# Patient Record
Sex: Female | Born: 1970 | Race: White | Hispanic: No | Marital: Married | Smoking: Current every day smoker
Health system: Southern US, Community
[De-identification: ages and names within clinical notes are randomized; demographics above are authoritative.]

## PROBLEM LIST (undated history)

## (undated) HISTORY — PX: NO PAST SURGERIES: SHX2092

---

## 2009-05-12 ENCOUNTER — Encounter: Payer: Self-pay | Admitting: Obstetrics & Gynecology

## 2009-05-12 ENCOUNTER — Ambulatory Visit: Payer: Self-pay | Admitting: Family Medicine

## 2009-05-23 ENCOUNTER — Encounter: Payer: Self-pay | Admitting: Obstetrics & Gynecology

## 2013-12-31 ENCOUNTER — Ambulatory Visit: Payer: Self-pay | Admitting: Family

## 2014-02-10 ENCOUNTER — Ambulatory Visit: Payer: Self-pay | Admitting: Family

## 2015-11-02 ENCOUNTER — Other Ambulatory Visit: Payer: Self-pay | Admitting: Physician Assistant

## 2015-11-02 DIAGNOSIS — Z1231 Encounter for screening mammogram for malignant neoplasm of breast: Secondary | ICD-10-CM

## 2015-11-04 ENCOUNTER — Encounter (HOSPITAL_COMMUNITY): Payer: Self-pay

## 2015-11-04 ENCOUNTER — Ambulatory Visit
Admission: RE | Admit: 2015-11-04 | Discharge: 2015-11-04 | Disposition: A | Discharge status: Home / Self Care | Payer: No Typology Code available for payment source | Source: Ambulatory Visit | Attending: Physician Assistant | Admitting: Physician Assistant

## 2015-11-04 DIAGNOSIS — Z1231 Encounter for screening mammogram for malignant neoplasm of breast: Secondary | ICD-10-CM | POA: Insufficient documentation

## 2016-11-04 ENCOUNTER — Other Ambulatory Visit: Payer: Self-pay | Admitting: Physician Assistant

## 2016-11-04 DIAGNOSIS — Z1231 Encounter for screening mammogram for malignant neoplasm of breast: Secondary | ICD-10-CM

## 2016-11-07 ENCOUNTER — Ambulatory Visit
Admission: RE | Admit: 2016-11-07 | Discharge: 2016-11-07 | Disposition: A | Discharge status: Home / Self Care | Payer: PRIVATE HEALTH INSURANCE | Source: Ambulatory Visit | Attending: Physician Assistant | Admitting: Physician Assistant

## 2016-11-07 DIAGNOSIS — Z1231 Encounter for screening mammogram for malignant neoplasm of breast: Secondary | ICD-10-CM | POA: Insufficient documentation

## 2016-11-09 ENCOUNTER — Other Ambulatory Visit: Payer: Self-pay | Admitting: Physician Assistant

## 2016-11-09 DIAGNOSIS — R928 Other abnormal and inconclusive findings on diagnostic imaging of breast: Secondary | ICD-10-CM

## 2016-11-09 DIAGNOSIS — N6489 Other specified disorders of breast: Secondary | ICD-10-CM

## 2016-11-22 ENCOUNTER — Ambulatory Visit
Admission: RE | Admit: 2016-11-22 | Discharge: 2016-11-22 | Disposition: A | Discharge status: Home / Self Care | Payer: No Typology Code available for payment source | Source: Ambulatory Visit | Attending: Physician Assistant | Admitting: Physician Assistant

## 2016-11-22 DIAGNOSIS — N6489 Other specified disorders of breast: Secondary | ICD-10-CM

## 2016-11-22 DIAGNOSIS — R928 Other abnormal and inconclusive findings on diagnostic imaging of breast: Secondary | ICD-10-CM

## 2017-08-02 ENCOUNTER — Encounter: Payer: Self-pay | Admitting: Emergency Medicine

## 2017-08-02 ENCOUNTER — Other Ambulatory Visit: Payer: Self-pay

## 2017-08-02 ENCOUNTER — Ambulatory Visit
Admission: EM | Admit: 2017-08-02 | Discharge: 2017-08-02 | Disposition: A | Discharge status: Home / Self Care | Payer: No Typology Code available for payment source | Attending: Emergency Medicine | Admitting: Emergency Medicine

## 2017-08-02 DIAGNOSIS — B86 Scabies: Secondary | ICD-10-CM

## 2017-08-02 MED ORDER — PERMETHRIN 5 % EX CREA: TOPICAL_CREAM | CUTANEOUS | 0 refills | Status: DC

## 2017-08-02 MED ORDER — PREDNISONE 10 MG (21) PO TBPK: ORAL_TABLET | ORAL | 0 refills | Status: DC

## 2017-08-02 MED ORDER — HYDROXYZINE HCL 25 MG PO TABS: 25.0000 mg | ORAL_TABLET | Freq: Four times a day (QID) | ORAL | 0 refills | Status: DC | PRN

## 2017-08-02 NOTE — ED Provider Notes (Addendum)
HPI  SUBJECTIVE:  Rhonda Rodriguez is a 47 y.o. female who presents with an intensely itchy rash with "pins-and-needles" sensation starting on her left arm, now has spread to the back of her neck for the past week.  She states it feels as if the rash is trying to go to her right arm.  She denies fevers, body aches, new lotions, soaps, detergents.  She does not take any medicines on a regular basis.  No recent antibiotics.  No exposure to poison ivy, poison oak.  She has not been in the woods or gardening recently.  She states her husband has an identical rash.  States that he got this first.  She states that the itching is worse at night.  She denies the sensation of being bitten at night, blood on the bed clothes in the morning.  They have an indoor dog, but it does not have fleas.  She tried calamine lotion, multiple itch and medicated creams with very brief improvement, no aggravating factors.  Past medical history of chickenpox.  No history of diabetes, hypertension, shingles.  PMD: None.  History reviewed. No pertinent past medical history.  History reviewed. No pertinent surgical history.  Family History  Problem Relation Age of Onset  . Breast cancer Neg Hx     Social History   Tobacco Use  . Smoking status: Current Every Day Smoker    Types: Cigarettes  . Smokeless tobacco: Never Used  Substance Use Topics  . Alcohol use: Never    Frequency: Never  . Drug use: Not on file    No current facility-administered medications for this encounter.   Current Outpatient Medications:  .  hydrOXYzine (ATARAX/VISTARIL) 25 MG tablet, Take 1 tablet (25 mg total) by mouth every 6 (six) hours as needed., Disp: 20 tablet, Rfl: 0 .  permethrin (ELIMITE) 5 % cream, Apply from chin down, leave on for 8-14 hours, rinse. Repeat in 1 week, Disp: 60 g, Rfl: 0 .  predniSONE (STERAPRED UNI-PAK 21 TAB) 10 MG (21) TBPK tablet, Dispense one 6 day pack. Take as directed with food., Disp: 21 tablet, Rfl: 0  No  Known Allergies   ROS  As noted in HPI.   Physical Exam  BP 116/80 (BP Location: Right Arm)   Pulse 63   Temp 98 F (36.7 C) (Oral)   Resp 16   Ht 5\' 1"  (1.549 m)   Wt 110 lb (49.9 kg)   SpO2 98%   BMI 20.78 kg/m   Constitutional: Well developed, well nourished, no acute distress Eyes:  EOMI, conjunctiva normal bilaterally HENT: Normocephalic, atraumatic,mucus membranes moist Respiratory: Normal inspiratory effort Cardiovascular: Normal rate GI: nondistended skin: Erythematous papular rash over the left elbow, posterior upper left arm, neck.  Positive excoriation over the right thumb.  No burrows between fingers.  No crusting, blisters.  See pictures.          Musculoskeletal: no deformities Neurologic: Alert & oriented x 3, no focal neuro deficits Psychiatric: Speech and behavior appropriate   ED Course   Medications - No data to display  No orders of the defined types were placed in this encounter.   No results found for this or any previous visit (from the past 24 hour(s)). No results found.  ED Clinical Impression  Scabies   ED Assessment/Plan  Presentation suggestive of scabies.  Will send home with permethrin with a refill so that she can treat her husband, 6-day prednisone taper, Claritin or Zyrtec, and if this  does not work, she is to try Atarax.  Will provide a primary care list for ongoing care.  She may return here or follow-up with a primary care physician of her choice if this treatment does not work.   Discussed MDM, treatment plan, and plan for follow-up with patient. patient agrees with plan.   Meds ordered this encounter  Medications  . hydrOXYzine (ATARAX/VISTARIL) 25 MG tablet    Sig: Take 1 tablet (25 mg total) by mouth every 6 (six) hours as needed.    Dispense:  20 tablet    Refill:  0  . permethrin (ELIMITE) 5 % cream    Sig: Apply from chin down, leave on for 8-14 hours, rinse. Repeat in 1 week    Dispense:  60 g     Refill:  0  . predniSONE (STERAPRED UNI-PAK 21 TAB) 10 MG (21) TBPK tablet    Sig: Dispense one 6 day pack. Take as directed with food.    Dispense:  21 tablet    Refill:  0    *This clinic note was created using Scientist, clinical (histocompatibility and immunogenetics). Therefore, there may be occasional mistakes despite careful proofreading.   ?   Domenick Gong, MD 08/02/17 1048    Domenick Gong, MD 08/02/17 1048

## 2017-08-02 NOTE — Discharge Instructions (Addendum)
Scabies is usually transmitted through close physical contact, but may be transmitted through clothing, linens, or towels. Apply the permethrin from head to soles of feet at bedtime, leave on for 8-14 hours. Mites tend to persist under the fingernails. Trim them, scrub beneath them, and apply the permetrhin under your nails. Repeat 1 week later. You will also need to treat family members, frequent household guests, close physical or sexual contacts, even if they have no symptoms. Wash all clothing, bedding, and towels in hot water, dry cleaned, or placed through the heat cycle of a dryer to prevent reinfection. Or you can place all bedding and clothing that might be infected in sealed plastic bags for 72 hours.  Make sure you thoroughly clean the infected person's room. The itching will not go away immediately. Continued itching does not mean that the treatment was ineffective. Dead mites and eggs will cause an immune response but will eventually go away as the skin turns over.   Here is a list of primary care providers who are taking new patients:  Dr. Elizabeth Sauereanna Jones, Dr. Schuyler AmorWilliam Plonk [address] [place] [address] [phone]  [facility] [place] [address]  [place] [address]  [phone]  [facility] [address]  [place], [place] [address] [phone]  [facility] [place] [address]  [phone] [place], [place] [address]  Here are clinics/ other resources who will see you if you do not have insurance. Some have certain criteria that you must meet. Call them and find out what they are:  [facility]: [address] [place] [place], [place] [address] Phone: [phone] Hours: First and Third Saturdays of each Month, 9 a.m. - 1 p.m.  [facility]: [address], [place], [place] [address] Phone: [phone] Hours: Tuesday, 4 p.m. - 8 p.m. Thursday, 1 p.m. - 8 p.m. Wednesday, 9 a.m. - [facility]  [facility] [address], [place], [place] [address] Phone: [phone] Pharmacy Phone Number: [phone] Dental Phone Number: [phone] [facility] Insurance Help: [phone]  Dental Hours: Monday - Thursday, 8 a.m. - 6 p.m.  [facility] [address] [place], [place] [address] Phone: [phone] Pharmacy Phone Number: [phone] [facility] Insurance Help: [phone]  [facility] [address] [place], [place] [address] Phone: [phone] Pharmacy Phone Number: [phone] [facility] Insurance Help: [phone]  [facility] [address] [place], [place] [address] Phone: [phone] [facility] Insurance Help: [phone]   [facility] [address] [place], [place] [address] Phone: [phone]  Go to www.goodrx.com to look up your medications. This will give you a list of where you can find your prescriptions at the most affordable prices. Or ask the pharmacist what the cash price is, or if they have any other discount programs available to help make your medication more affordable. This can be less expensive than what you would pay with insurance.

## 2017-08-02 NOTE — ED Triage Notes (Signed)
Patient c/o itchy rash on her left arm and neck for over a week.

## 2017-08-12 ENCOUNTER — Telehealth: Payer: Self-pay | Admitting: Emergency Medicine

## 2017-08-12 MED ORDER — PERMETHRIN 5 % EX CREA: TOPICAL_CREAM | CUTANEOUS | 0 refills | Status: DC

## 2017-08-12 NOTE — Telephone Encounter (Signed)
Patient called in requesting refill for Elimite cream. Stated Rhonda Rodriguez was supposed to repeat treatment in 1 week but there were no refills. [facility] [place].

## 2017-08-12 NOTE — Telephone Encounter (Signed)
Verbal order received from Renford DillsLindsey Miller, NP to send in another dose of Elimite cream for patient.

## 2018-10-20 ENCOUNTER — Other Ambulatory Visit: Payer: Self-pay | Admitting: Physician Assistant

## 2018-10-20 DIAGNOSIS — Z1231 Encounter for screening mammogram for malignant neoplasm of breast: Secondary | ICD-10-CM

## 2018-10-28 ENCOUNTER — Ambulatory Visit: Payer: No Typology Code available for payment source

## 2019-04-19 ENCOUNTER — Ambulatory Visit: Payer: No Typology Code available for payment source | Attending: Internal Medicine

## 2019-04-19 DIAGNOSIS — Z23 Encounter for immunization: Secondary | ICD-10-CM

## 2019-04-19 NOTE — Progress Notes (Signed)
   Covid-19 Vaccination Clinic  Name:  EMMA-LEE ODDO    MRN: [id number] DOB: 06-Jun-1970  04/19/2019  Ms. Derstine was observed post Covid-19 immunization for 15 minutes without incident. She was provided with Vaccine Information Sheet and instruction to access the V-Safe system.   Ms. Kronick was instructed to call 911 with any severe reactions post vaccine: Marland Kitchen Difficulty breathing  . Swelling of face and throat  . A fast heartbeat  . A bad rash all over body  . Dizziness and weakness   Immunizations Administered    Name Date Dose VIS Date Route   Pfizer COVID-19 Vaccine 04/19/2019  4:00 PM 0.3 mL 01/02/2019 Intramuscular   Manufacturer: [employer], [employer]   Lot: [id number]   NDC: [address]-1

## 2019-05-10 ENCOUNTER — Ambulatory Visit: Payer: No Typology Code available for payment source | Attending: Internal Medicine

## 2019-05-10 DIAGNOSIS — Z23 Encounter for immunization: Secondary | ICD-10-CM

## 2019-05-10 NOTE — Progress Notes (Signed)
   Covid-19 Vaccination Clinic  Name:  Rhonda Rodriguez    MRN: [id number] DOB: 21-Mar-1970  05/10/2019  [name] was observed post Covid-19 immunization for 15 minutes without incident. She was provided with Vaccine Information Sheet and instruction to access the V-Safe system.   [name] was instructed to call 911 with any severe reactions post vaccine: Marland Kitchen Difficulty breathing  . Swelling of face and throat  . A fast heartbeat  . A bad rash all over body  . Dizziness and weakness   Immunizations Administered    Name Date Dose VIS Date Route   Pfizer COVID-19 Vaccine 05/10/2019  3:19 PM 0.3 mL 01/02/2019 Intramuscular   Manufacturer: [employer], [employer]   Lot: [id number]   NDC: [address]-7

## 2019-09-24 ENCOUNTER — Ambulatory Visit
Admission: EM | Admit: 2019-09-24 | Discharge: 2019-09-24 | Disposition: A | Discharge status: Home / Self Care | Payer: BC Managed Care – PPO | Attending: Family Medicine | Admitting: Family Medicine

## 2019-09-24 ENCOUNTER — Other Ambulatory Visit: Payer: Self-pay

## 2019-09-24 DIAGNOSIS — Z20822 Contact with and (suspected) exposure to covid-19: Secondary | ICD-10-CM | POA: Insufficient documentation

## 2019-09-24 DIAGNOSIS — B9789 Other viral agents as the cause of diseases classified elsewhere: Secondary | ICD-10-CM | POA: Insufficient documentation

## 2019-09-24 DIAGNOSIS — J988 Other specified respiratory disorders: Secondary | ICD-10-CM | POA: Diagnosis present

## 2019-09-24 LAB — SARS CORONAVIRUS 2 (TAT 6-24 HRS): SARS Coronavirus 2: NEGATIVE

## 2019-09-24 MED ORDER — CETIRIZINE HCL 10 MG PO TABS: 10.0000 mg | ORAL_TABLET | Freq: Every day | ORAL | 0 refills | Status: AC

## 2019-09-24 MED ORDER — BENZONATATE 200 MG PO CAPS: 200.0000 mg | ORAL_CAPSULE | Freq: Three times a day (TID) | ORAL | 0 refills | Status: AC | PRN

## 2019-09-24 MED ORDER — ALBUTEROL SULFATE HFA 108 (90 BASE) MCG/ACT IN AERS: 1.0000 | INHALATION_SPRAY | Freq: Four times a day (QID) | RESPIRATORY_TRACT | 0 refills | Status: AC | PRN

## 2019-09-24 NOTE — ED Provider Notes (Signed)
[facility]    CSN: [id number] Arrival date & time: 09/24/19  0820      History   Chief Complaint Chief Complaint  Patient presents with  . Cough   HPI  49 year old female presents with respiratory symptoms.  Patient reports that her symptoms started on Monday.  She reports cough, congestion, chills, body aches, sneezing.  No documented fever.  She has tried several over-the-counter medications including Alka-Seltzer, Tylenol Cold and flu, aspirin, and Sudafed without relief.  No reported sick contacts.  Denies pain at this time.  No known exacerbating factors.  She is most bothered by the cough.  Patient also notes shortness of breath.  She is a current smoker.  Denies history of COPD.  No other associated symptoms.  No other complaints  Past Surgical History:  Procedure Laterality Date  . NO PAST SURGERIES      OB History   No obstetric history on file.      Home Medications    Prior to Admission medications   Medication Sig Start Date End Date Taking? Authorizing Provider  albuterol (VENTOLIN HFA) 108 (90 Base) MCG/ACT inhaler Inhale 1-2 puffs into the lungs every 6 (six) hours as needed for wheezing or shortness of breath. 09/24/19   Tommie Sams, DO  benzonatate (TESSALON) 200 MG capsule Take 1 capsule (200 mg total) by mouth 3 (three) times daily as needed for cough. 09/24/19   Tommie Sams, DO  cetirizine (ZYRTEC ALLERGY) 10 MG tablet Take 1 tablet (10 mg total) by mouth daily. 09/24/19   Tommie Sams, DO    Family History Family History  Problem Relation Age of Onset  . Diabetes Mother   . Heart attack Mother   . Hypertension Father   . ALS Father   . Breast cancer Neg Hx     Social History Social History   Tobacco Use  . Smoking status: Current Every Day Smoker    Packs/day: 0.50    Types: Cigarettes  . Smokeless tobacco: Never Used  Vaping Use  . Vaping Use: Never used  Substance Use Topics  . Alcohol use: Never  . Drug use: Never      Allergies   Patient has no known allergies.   Review of Systems Review of Systems Per HPI  Physical Exam Triage Vital Signs ED Triage Vitals  Enc Vitals Group     BP 09/24/19 0856 (!) 143/85     Pulse Rate 09/24/19 0856 72     Resp 09/24/19 0856 18     Temp 09/24/19 0856 97.7 F (36.5 C)     Temp Source 09/24/19 0856 Oral     SpO2 09/24/19 0856 99 %     Weight 09/24/19 0853 120 lb (54.4 kg)     Height 09/24/19 0853 5' (1.524 m)     Head Circumference --      Peak Flow --      Pain Score 09/24/19 0853 0     Pain Loc --      Pain Edu? --      Excl. in GC? --     Updated Vital Signs BP (!) 143/85 (BP Location: Left Arm)   Pulse 72   Temp 97.7 F (36.5 C) (Oral)   Resp 18   Ht 5' (1.524 m)   Wt 54.4 kg   SpO2 99%   BMI 23.44 kg/m   Visual Acuity Right Eye Distance:   Left Eye Distance:   Bilateral Distance:  Right Eye Near:   Left Eye Near:    Bilateral Near:     Physical Exam Vitals and nursing note reviewed.  Constitutional:      General: She is not in acute distress.    Appearance: Normal appearance. She is not ill-appearing.  HENT:     Head: Normocephalic and atraumatic.  Eyes:     General:        Right eye: No discharge.        Left eye: No discharge.     Conjunctiva/sclera: Conjunctivae normal.  Cardiovascular:     Rate and Rhythm: Normal rate and regular rhythm.  Pulmonary:     Effort: Pulmonary effort is normal.     Breath sounds: Normal breath sounds. No wheezing, rhonchi or rales.  Neurological:     Mental Status: She is alert.  Psychiatric:        Mood and Affect: Mood normal.        Behavior: Behavior normal.    UC Treatments / Results  Labs (all labs ordered are listed, but only abnormal results are displayed) Labs Reviewed  SARS CORONAVIRUS 2 (TAT 6-24 HRS)    EKG   Radiology No results found.  Procedures Procedures (including critical care time)  Medications Ordered in UC Medications - No data to  display  Initial Impression / Assessment and Plan / UC Course  I have reviewed the triage vital signs and the nursing notes.  Pertinent labs & imaging results that were available during my care of the patient were reviewed by me and considered in my medical decision making (see chart for details).    49 year old female presents with a viral respiratory infection.  Concern for COVID-19.  Awaiting test results.  Treating with albuterol, Tessalon Perles, Zyrtec.  Final Clinical Impressions(s) / UC Diagnoses   Final diagnoses:  Viral respiratory infection  Encounter for laboratory testing for COVID-19 virus     Discharge Instructions     Rest.  Medications as prescribed.  COVID test will be back tomorrow.  Take care  Dr. Adriana Simas    ED Prescriptions    Medication Sig Dispense Auth. Provider   albuterol (VENTOLIN HFA) 108 (90 Base) MCG/ACT inhaler Inhale 1-2 puffs into the lungs every 6 (six) hours as needed for wheezing or shortness of breath. 18 g Reeves Musick G, DO   benzonatate (TESSALON) 200 MG capsule Take 1 capsule (200 mg total) by mouth 3 (three) times daily as needed for cough. 30 capsule Reece Fehnel G, DO   cetirizine (ZYRTEC ALLERGY) 10 MG tablet Take 1 tablet (10 mg total) by mouth daily. 30 tablet Tommie Sams, DO     PDMP not reviewed this encounter.   Everlene Other [place], [place] 09/24/19 [phone]

## 2019-09-24 NOTE — Discharge Instructions (Signed)
Rest.  Medications as prescribed.  COVID test will be back tomorrow.  Take care  Dr. Adriana Simas

## 2019-09-24 NOTE — ED Triage Notes (Signed)
Patient complains of cough, congestion, fever, chills and body aches, sneezing since Monday. States that she has been taken alkaseltzer, tylenol cold and flu, aspirin, sudaffed with no relief.

## 2020-10-31 ENCOUNTER — Other Ambulatory Visit: Payer: Self-pay | Admitting: Physician Assistant

## 2020-10-31 ENCOUNTER — Ambulatory Visit
Admission: RE | Admit: 2020-10-31 | Discharge: 2020-10-31 | Disposition: A | Discharge status: Home / Self Care | Payer: BC Managed Care – PPO | Source: Ambulatory Visit | Attending: Physician Assistant | Admitting: Physician Assistant

## 2020-10-31 ENCOUNTER — Other Ambulatory Visit: Payer: Self-pay

## 2020-10-31 DIAGNOSIS — Z1231 Encounter for screening mammogram for malignant neoplasm of breast: Secondary | ICD-10-CM

## 2020-11-08 ENCOUNTER — Other Ambulatory Visit: Payer: Self-pay | Admitting: Physician Assistant

## 2020-11-08 DIAGNOSIS — R928 Other abnormal and inconclusive findings on diagnostic imaging of breast: Secondary | ICD-10-CM

## 2020-11-08 DIAGNOSIS — N6489 Other specified disorders of breast: Secondary | ICD-10-CM

## 2020-11-15 ENCOUNTER — Telehealth: Payer: Self-pay | Admitting: *Deleted

## 2020-11-15 NOTE — Telephone Encounter (Signed)
Patients screening mammogram done on GI mobile unit was listed with Anette Riedel as the provider.  Charm Barges called me on 10/25 to let me know that Anette Riedel was no longer their at [facility].  I called the pt to ask about her physician.  Pt stated she does not currently have a physician but was scheduled to establish care at [facility] on 12/6.  Pt is aware an order is needed to schedule her AV / mammo.  Pt will f/u after she has established care with her new doctor.

## 2021-03-24 ENCOUNTER — Other Ambulatory Visit: Payer: Self-pay | Admitting: Physician Assistant

## 2021-03-24 DIAGNOSIS — N6489 Other specified disorders of breast: Secondary | ICD-10-CM

## 2021-03-24 DIAGNOSIS — R928 Other abnormal and inconclusive findings on diagnostic imaging of breast: Secondary | ICD-10-CM

## 2021-04-11 ENCOUNTER — Ambulatory Visit
Admission: RE | Admit: 2021-04-11 | Discharge: 2021-04-11 | Disposition: A | Discharge status: Home / Self Care | Payer: BC Managed Care – PPO | Source: Ambulatory Visit | Attending: Physician Assistant | Admitting: Physician Assistant

## 2021-04-11 ENCOUNTER — Other Ambulatory Visit: Payer: Self-pay

## 2021-04-11 DIAGNOSIS — N6489 Other specified disorders of breast: Secondary | ICD-10-CM

## 2021-04-11 DIAGNOSIS — R928 Other abnormal and inconclusive findings on diagnostic imaging of breast: Secondary | ICD-10-CM

## 2022-02-20 ENCOUNTER — Encounter: Payer: Self-pay | Admitting: Family Medicine

## 2022-02-20 DIAGNOSIS — R1030 Lower abdominal pain, unspecified: Secondary | ICD-10-CM

## 2022-02-21 ENCOUNTER — Other Ambulatory Visit: Payer: Self-pay | Admitting: Family Medicine

## 2022-02-21 DIAGNOSIS — R1030 Lower abdominal pain, unspecified: Secondary | ICD-10-CM

## 2022-03-05 ENCOUNTER — Ambulatory Visit: Payer: BC Managed Care – PPO

## 2022-10-01 ENCOUNTER — Ambulatory Visit (INDEPENDENT_AMBULATORY_CARE_PROVIDER_SITE_OTHER): Payer: BC Managed Care – PPO

## 2022-10-01 ENCOUNTER — Ambulatory Visit
Admission: EM | Admit: 2022-10-01 | Discharge: 2022-10-01 | Disposition: A | Discharge status: Home / Self Care | Payer: BC Managed Care – PPO | Attending: Emergency Medicine | Admitting: Emergency Medicine

## 2022-10-01 ENCOUNTER — Encounter: Payer: Self-pay | Admitting: Emergency Medicine

## 2022-10-01 DIAGNOSIS — M79672 Pain in left foot: Secondary | ICD-10-CM

## 2022-10-01 DIAGNOSIS — S9032XA Contusion of left foot, initial encounter: Secondary | ICD-10-CM

## 2022-10-01 NOTE — ED Provider Notes (Signed)
[facility]    CSN: [id number] Arrival date & time: 10/01/22  1420      History   Chief Complaint Chief Complaint  Patient presents with   Foot Pain    HPI Rhonda Rodriguez is a 52 y.o. female.   Patient states that she was going to the ER tripped and fell and has pain bruising to left foot and left big toe.  States that she has increased swelling and not able to bend since injury.  Has never injured before.  Has not taken anything prior to arrival    History reviewed. No pertinent past medical history.  There are no problems to display for this patient.   Past Surgical History:  Procedure Laterality Date   NO PAST SURGERIES      OB History   No obstetric history on file.      Home Medications    Prior to Admission medications   Medication Sig Start Date End Date Taking? Authorizing Provider  albuterol (VENTOLIN HFA) 108 (90 Base) MCG/ACT inhaler Inhale 1-2 puffs into the lungs every 6 (six) hours as needed for wheezing or shortness of breath. 09/24/19   Tommie Sams, DO  benzonatate (TESSALON) 200 MG capsule Take 1 capsule (200 mg total) by mouth 3 (three) times daily as needed for cough. 09/24/19   Tommie Sams, DO  cetirizine (ZYRTEC ALLERGY) 10 MG tablet Take 1 tablet (10 mg total) by mouth daily. 09/24/19   Tommie Sams, DO    Family History Family History  Problem Relation Age of Onset   Diabetes Mother    Heart attack Mother    Hypertension Father    ALS Father    Breast cancer Neg Hx     Social History Social History   Tobacco Use   Smoking status: Every Day    Current packs/day: 0.50    Types: Cigarettes   Smokeless tobacco: Never  Vaping Use   Vaping status: Never Used  Substance Use Topics   Alcohol use: Never   Drug use: Never     Allergies   Patient has no known allergies.   Review of Systems Review of Systems  Constitutional: Negative.   Respiratory: Negative.    Cardiovascular: Negative.   Gastrointestinal:  Negative.   Genitourinary: Negative.   Musculoskeletal:  Positive for joint swelling.       Swelling and pain left big toe  Skin:        Bruising to left top of foot and underneath the left toe.  +1 edema  Neurological: Negative.      Physical Exam Triage Vital Signs ED Triage Vitals  Encounter Vitals Group     BP 10/01/22 1502 (!) 147/93     Systolic BP Percentile --      Diastolic BP Percentile --      Pulse Rate 10/01/22 1502 85     Resp 10/01/22 1502 18     Temp 10/01/22 1502 98.4 F (36.9 C)     Temp Source 10/01/22 1502 Oral     SpO2 10/01/22 1502 96 %     Weight --      Height --      Head Circumference --      Peak Flow --      Pain Score 10/01/22 1501 8     Pain Loc --      Pain Education --      Exclude from Growth Chart --    No  data found.  Updated Vital Signs BP (!) 147/93 (BP Location: Right Arm)   Pulse 85   Temp 98.4 F (36.9 C) (Oral)   Resp 18   SpO2 96%   Visual Acuity Right Eye Distance:   Left Eye Distance:   Bilateral Distance:    Right Eye Near:   Left Eye Near:    Bilateral Near:     Physical Exam Constitutional:      Appearance: Normal appearance.  Cardiovascular:     Rate and Rhythm: Normal rate.  Pulmonary:     Effort: Pulmonary effort is normal.  Abdominal:     General: Abdomen is flat. Bowel sounds are normal.  Musculoskeletal:        General: Swelling, tenderness and signs of injury present.     Comments: Decreased range of motion to left big toe.  Injury with bruising +1 nonpitting edema.  Strong pedal pulses warm to touch.  No signs of erythema surrounding tissue  Skin:    Capillary Refill: Capillary refill takes 2 to 3 seconds.     Findings: Bruising present.  Neurological:     Mental Status: She is alert.      UC Treatments / Results  Labs (all labs ordered are listed, but only abnormal results are displayed) Labs Reviewed - No data to display  EKG   Radiology No results found.  Procedures Procedures  (including critical care time)  Medications Ordered in UC Medications - No data to display  Initial Impression / Assessment and Plan / UC Course  I have reviewed the triage vital signs and the nursing notes.  Pertinent labs & imaging results that were available during my care of the patient were reviewed by me and considered in my medical decision making (see chart for details).     Take Tylenol or Motrin as needed for pain Elevate foot to prevent swelling Call to follow-up with [facility] Can use warm compresses to help with soothing  Final Clinical Impressions(s) / UC Diagnoses   Final diagnoses:  None   Discharge Instructions   None    ED Prescriptions   None    PDMP not reviewed this encounter.   Coralyn Mark, NP 10/01/22 [phone]

## 2022-10-01 NOTE — ED Triage Notes (Signed)
Pt fell 2 days ago and injured her left foot. She has bruising on the top and bottom of her feet.

## 2022-10-01 NOTE — Discharge Instructions (Signed)
Take Tylenol or Motrin as needed for pain Elevate foot to prevent swelling Call to follow-up with [facility] Can use warm compresses to help with soothing

## 2023-02-08 ENCOUNTER — Ambulatory Visit (INDEPENDENT_AMBULATORY_CARE_PROVIDER_SITE_OTHER): Payer: BC Managed Care – PPO

## 2023-02-08 ENCOUNTER — Ambulatory Visit
Admission: EM | Admit: 2023-02-08 | Discharge: 2023-02-08 | Disposition: A | Discharge status: Home / Self Care | Payer: BC Managed Care – PPO | Attending: Emergency Medicine | Admitting: Emergency Medicine

## 2023-02-08 ENCOUNTER — Telehealth: Payer: Self-pay | Admitting: Physician Assistant

## 2023-02-08 DIAGNOSIS — K5900 Constipation, unspecified: Secondary | ICD-10-CM | POA: Diagnosis present

## 2023-02-08 DIAGNOSIS — R109 Unspecified abdominal pain: Secondary | ICD-10-CM | POA: Diagnosis present

## 2023-02-08 DIAGNOSIS — S39012A Strain of muscle, fascia and tendon of lower back, initial encounter: Secondary | ICD-10-CM

## 2023-02-08 DIAGNOSIS — M545 Low back pain, unspecified: Secondary | ICD-10-CM | POA: Insufficient documentation

## 2023-02-08 LAB — URINALYSIS, W/ REFLEX TO CULTURE (INFECTION SUSPECTED)
Bacteria, UA: NONE SEEN
Bilirubin Urine: NEGATIVE
Glucose, UA: NEGATIVE mg/dL
Hgb urine dipstick: NEGATIVE
Ketones, ur: NEGATIVE mg/dL
Leukocytes,Ua: NEGATIVE
Nitrite: NEGATIVE
Protein, ur: NEGATIVE mg/dL
RBC / HPF: NONE SEEN RBC/hpf (ref 0–5)
Specific Gravity, Urine: 1.01 (ref 1.005–1.030)
WBC, UA: NONE SEEN WBC/hpf (ref 0–5)
pH: 6.5 (ref 5.0–8.0)

## 2023-02-08 MED ORDER — BACLOFEN 10 MG PO TABS: 10.0000 mg | ORAL_TABLET | Freq: Three times a day (TID) | ORAL | 0 refills | Status: AC | PRN

## 2023-02-08 MED ORDER — NAPROXEN 500 MG PO TABS: 500.0000 mg | ORAL_TABLET | Freq: Two times a day (BID) | ORAL | 0 refills | Status: AC | PRN

## 2023-02-08 MED ORDER — SENNOSIDES-DOCUSATE SODIUM 8.6-50 MG PO TABS: 1.0000 | ORAL_TABLET | Freq: Every evening | ORAL | 0 refills | Status: AC | PRN

## 2023-02-08 MED ORDER — KETOROLAC TROMETHAMINE 30 MG/ML IJ SOLN
30.0000 mg | Freq: Once | INTRAMUSCULAR | Status: AC
  Administered 2023-02-08: 30 mg via INTRAMUSCULAR

## 2023-02-08 NOTE — ED Provider Notes (Signed)
[facility]    CSN: [id number] Arrival date & time: 02/08/23  1015      History   Chief Complaint Chief Complaint  Patient presents with   Back Pain   Abdominal Pain    HPI Rhonda Rodriguez is a 53 y.o. female presenting for 3-day history of lower back aching pain with radiation to the lower abdomen.  Pain is worse on the left side.  Pain made worse by movement.  States her back feels stiff when she stands.  Increased pain with leaning forward or extending back.  Pain radiated to the right hip yesterday.  No pain further down the legs, numbness/tingling or weakness.  No injuries.  Pain in back is worse than the abdominal pain.  Denies fever, fatigue, nausea/vomiting, constipation, diarrhea, dysuria, frequency, urgency, hematuria, vaginal discharge/itching or odor.  Normal appetite.  Has taken aspirin and says it has been helpful.  Reports a strenuous job and states that she strains her back often.  No history of kidney stones that she wants to make sure that is not what is causing her symptoms.  No other complaints.  HPI  History reviewed. No pertinent past medical history.  There are no active problems to display for this patient.   Past Surgical History:  Procedure Laterality Date   NO PAST SURGERIES      OB History   No obstetric history on file.      Home Medications    Prior to Admission medications   Medication Sig Start Date End Date Taking? Authorizing Provider  albuterol (VENTOLIN HFA) 108 (90 Base) MCG/ACT inhaler Inhale 1-2 puffs into the lungs every 6 (six) hours as needed for wheezing or shortness of breath. 09/24/19   Tommie Sams, DO  baclofen (LIORESAL) 10 MG tablet Take 1 tablet (10 mg total) by mouth 3 (three) times daily as needed for muscle spasms. 02/08/23   Shirlee Latch, PA-C  benzonatate (TESSALON) 200 MG capsule Take 1 capsule (200 mg total) by mouth 3 (three) times daily as needed for cough. 09/24/19   Tommie Sams, DO  cetirizine (ZYRTEC  ALLERGY) 10 MG tablet Take 1 tablet (10 mg total) by mouth daily. 09/24/19   Tommie Sams, DO  naproxen (NAPROSYN) 500 MG tablet Take 1 tablet (500 mg total) by mouth 2 (two) times daily as needed. 02/08/23   Shirlee Latch, PA-C  senna-docusate (SENOKOT-S) 8.6-50 MG tablet Take 1 tablet by mouth at bedtime as needed for mild constipation. 02/08/23   Shirlee Latch, PA-C    Family History Family History  Problem Relation Age of Onset   Diabetes Mother    Heart attack Mother    Hypertension Father    ALS Father    Breast cancer Neg Hx     Social History Social History   Tobacco Use   Smoking status: Every Day    Current packs/day: 0.50    Types: Cigarettes   Smokeless tobacco: Never  Vaping Use   Vaping status: Never Used  Substance Use Topics   Alcohol use: Never   Drug use: Never     Allergies   Patient has no known allergies.   Review of Systems Review of Systems  Constitutional:  Negative for diaphoresis, fever and unexpected weight change.  Respiratory:  Negative for cough and shortness of breath.   Cardiovascular:  Negative for chest pain.  Gastrointestinal:  Positive for abdominal pain. Negative for abdominal distention, anal bleeding, blood in stool, constipation,  diarrhea, nausea, rectal pain and vomiting.  Genitourinary:  Negative for difficulty urinating, frequency, hematuria, pelvic pain, urgency, vaginal bleeding and vaginal discharge.  Musculoskeletal:  Positive for back pain.  Neurological:  Negative for weakness.     Physical Exam Triage Vital Signs ED Triage Vitals  Encounter Vitals Group     BP 02/08/23 1058 (!) 148/83     Systolic BP Percentile --      Diastolic BP Percentile --      Pulse Rate 02/08/23 1058 70     Resp 02/08/23 1058 17     Temp 02/08/23 1058 (!) 97.5 F (36.4 C)     Temp Source 02/08/23 1058 Oral     SpO2 02/08/23 1058 97 %     Weight --      Height --      Head Circumference --      Peak Flow --      Pain Score  02/08/23 1057 8     Pain Loc --      Pain Education --      Exclude from Growth Chart --    No data found.  Updated Vital Signs BP (!) 148/83 (BP Location: Right Arm)   Pulse 70   Temp (!) 97.5 F (36.4 C) (Oral)   Resp 17   SpO2 97%       Physical Exam Vitals and nursing note reviewed.  Constitutional:      General: She is not in acute distress.    Appearance: Normal appearance. She is not ill-appearing or toxic-appearing.  HENT:     Head: Normocephalic and atraumatic.     Nose: Nose normal.     Mouth/Throat:     Mouth: Mucous membranes are moist.     Pharynx: Oropharynx is clear.  Eyes:     General: No scleral icterus.       Right eye: No discharge.        Left eye: No discharge.     Conjunctiva/sclera: Conjunctivae normal.  Cardiovascular:     Rate and Rhythm: Normal rate and regular rhythm.     Heart sounds: Normal heart sounds.  Pulmonary:     Effort: Pulmonary effort is normal. No respiratory distress.     Breath sounds: Normal breath sounds.  Abdominal:     Palpations: Abdomen is soft.     Tenderness: There is abdominal tenderness (LLQ). There is left CVA tenderness. There is no right CVA tenderness, guarding or rebound.  Musculoskeletal:     Cervical back: Neck supple.     Comments: BACK: No spinal pain. TTP bilateral paralumbar muscles (L>R).  Increased discomfort with forward flexion and extension.  Negative straight leg raise bilaterally.  Full strength of lower extremities.  Skin:    General: Skin is dry.  Neurological:     General: No focal deficit present.     Mental Status: She is alert. Mental status is at baseline.     Motor: No weakness.     Gait: Gait normal.  Psychiatric:        Mood and Affect: Mood normal.        Behavior: Behavior normal.      UC Treatments / Results  Labs (all labs ordered are listed, but only abnormal results are displayed) Labs Reviewed  URINALYSIS, W/ REFLEX TO CULTURE (INFECTION SUSPECTED)     EKG   Radiology DG Abdomen 1 View Result Date: 02/08/2023 CLINICAL DATA:  Flank pain. EXAM: ABDOMEN - 1 VIEW COMPARISON:  None Available. FINDINGS: Moderate colonic stool. Gas seen in nondilated loops of small and large bowel. No obstruction. No definite free air. There are some rounded densities in the pelvis which are indeterminate although possibly vascular. Degenerative changes of the spine. IMPRESSION: Moderate colonic stool.  Nonspecific bowel gas pattern. Electronically Signed   By: Karen Kays M.D.   On: 02/08/2023 13:52    Procedures Procedures (including critical care time)  Medications Ordered in UC Medications  ketorolac (TORADOL) 30 MG/ML injection 30 mg (30 mg Intramuscular Given 02/08/23 1136)    Initial Impression / Assessment and Plan / UC Course  I have reviewed the triage vital signs and the nursing notes.  Pertinent labs & imaging results that were available during my care of the patient were reviewed by me and considered in my medical decision making (see chart for details).   53 year old female presents for lower back pain with radiation to lower abdomen, worse on the left side x 3 days.  No associated GU, GI or vaginal symptoms.  No numbness, weakness or tingling.  No injuries.  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  Patient is afebrile and overall well-appearing.  Abdomen soft with mild tenderness of the left lower quadrant and subjective left CVA tenderness.  Tenderness of bilateral paralumbar muscles.  Reduced range of motion of back due to discomfort.  Pain with flexion and extension.  Negative straight leg raise.  Urinalysis clear.  KUB ordered to rule out renal stone.  Patient given 30 mg IM ketorolac for acute pain relief.  KUB with constipation and gas seen on wet read.  Advised patient we will contact her with the official results.  KUB shows moderate stool burden on radiology report.  Suspect some of patient's symptoms are related to constipation.  Sent  senna to pharmacy encourage increasing fluids.  Back pain likely related to lumbar strain given repeated bending lifting at work.  Sent baclofen and naproxen to pharmacy.  Thoroughly reviewed increasing rest and fluids.  Reviewed return and ED precautions.   Final Clinical Impressions(s) / UC Diagnoses   Final diagnoses:  Flank pain  Acute bilateral low back pain without sciatica  Constipation, unspecified constipation type  Strain of lumbar region, initial encounter     Discharge Instructions      -Normal urinalysis.  No infection. - X-ray does not show any kidney stones.  It does show that you are a bit constipated and have a lot of gas. - I will call you once I receive the official radiology results. - I may send a muscle relaxer and anti-inflammatory medicine to the pharmacy as long as your x-ray does not show any significant findings requiring further care. - For any acute worsening of your back pain or abdominal pain, fever, weakness please go to the ER.     ED Prescriptions   None    PDMP not reviewed this encounter.   Shirlee Latch, PA-C 02/08/23 1429

## 2023-02-08 NOTE — Discharge Instructions (Signed)
-  Normal urinalysis.  No infection. - X-ray does not show any kidney stones.  It does show that you are a bit constipated and have a lot of gas. - I will call you once I receive the official radiology results. - I may send a muscle relaxer and anti-inflammatory medicine to the pharmacy as long as your x-ray does not show any significant findings requiring further care. - For any acute worsening of your back pain or abdominal pain, fever, weakness please go to the ER.

## 2023-02-08 NOTE — ED Triage Notes (Signed)
Sx x 3 days  Lower back pain and lower abdominal pain. No vaginal discharge

## 2023-02-08 NOTE — Telephone Encounter (Signed)
Reviewed results of KUB with patient which shows moderate constipation.  Sent senna to pharmacy.  Also believe her back pain is related to lumbar strain and muscle spasm so I sent baclofen and naproxen.

## 2023-06-19 IMAGING — MG DIGITAL DIAGNOSTIC BILAT W/ TOMO W/ CAD
8 series · 8 of 24 positions shown · non-contrast
Comparison: Previous exam(s).

CLINICAL DATA: Screening recall for bilateral breast asymmetries.

EXAM:
DIGITAL DIAGNOSTIC BILATERAL MAMMOGRAM WITH TOMOSYNTHESIS AND CAD;
ULTRASOUND LEFT BREAST LIMITED
TECHNIQUE: Bilateral digital diagnostic mammography and breast tomosynthesis
was performed. The images were evaluated with computer-aided
detection.; Targeted ultrasound examination of the left breast was
performed.

[L ML synth-2D]
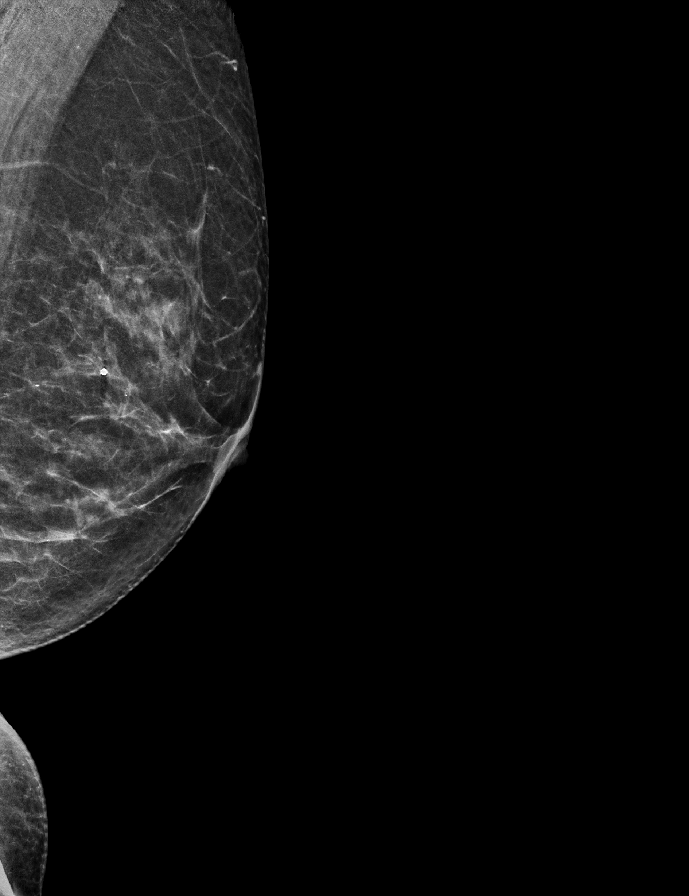

[R MLO synth-2D]
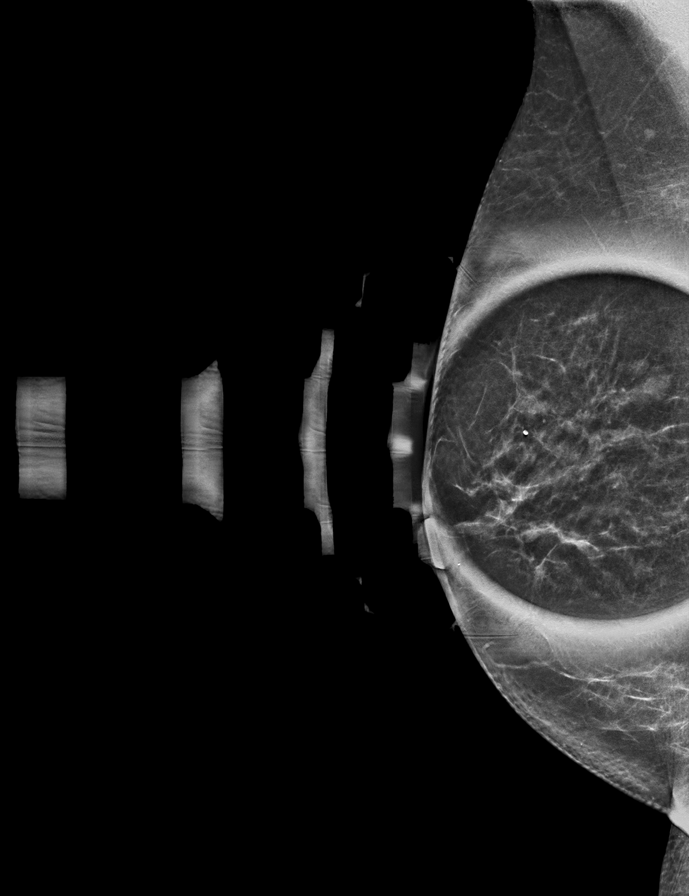

[R ML synth-2D]
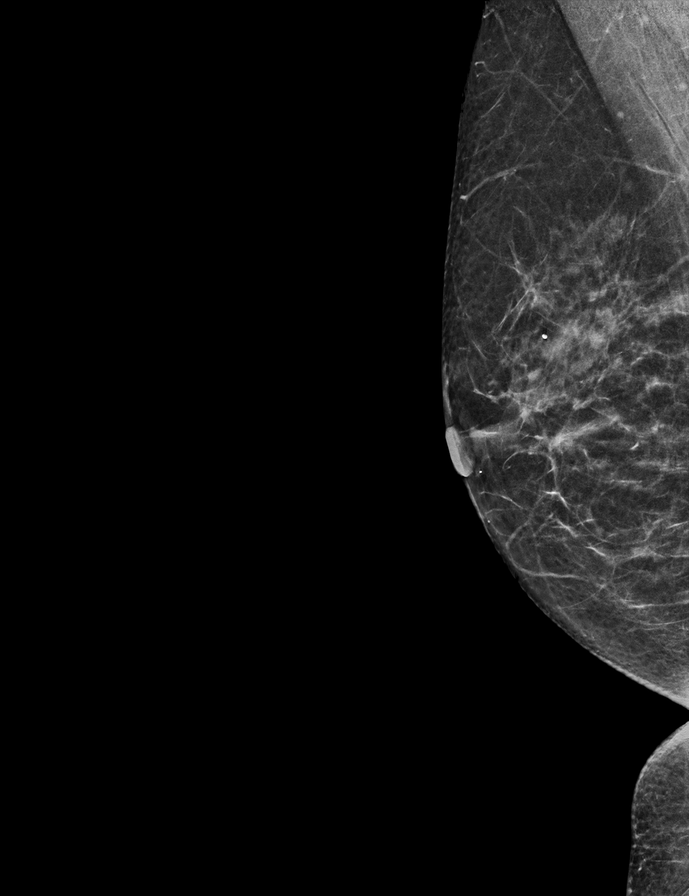

[L MLO synth-2D]
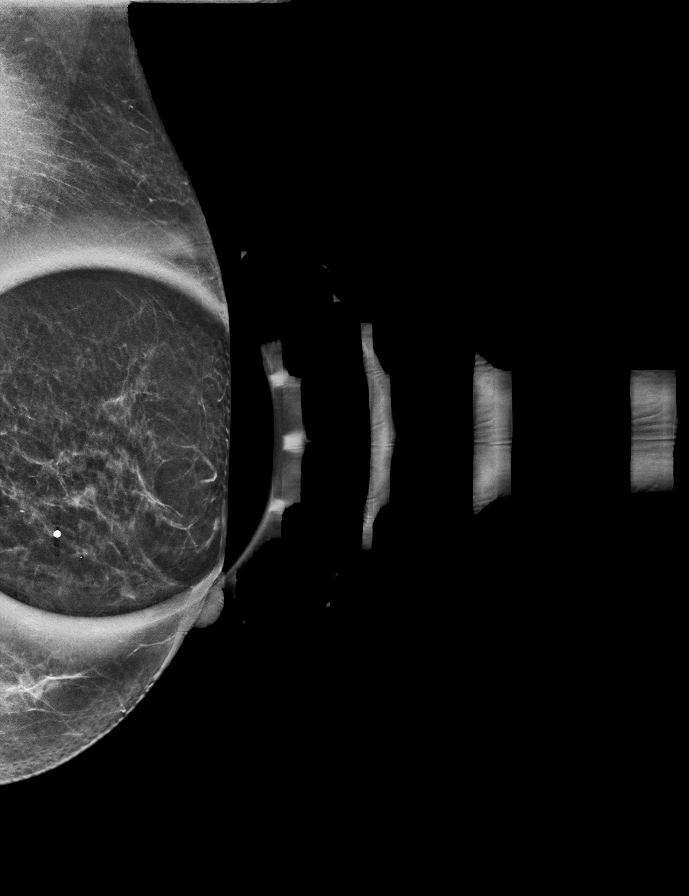

[R ML tomo · tomo slice 27/52.0]
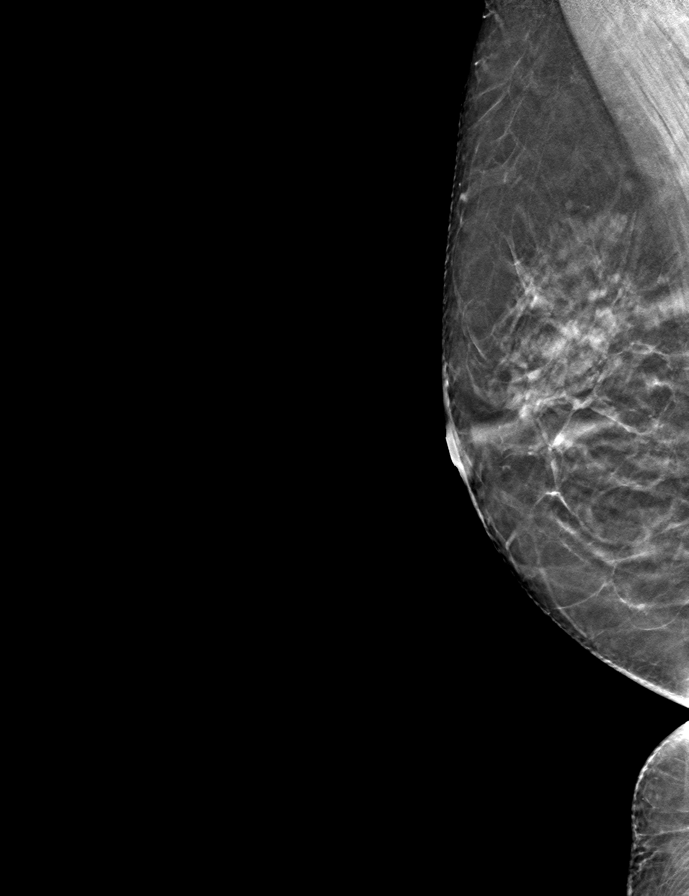

[L MLO tomo · tomo slice 27/52.0]
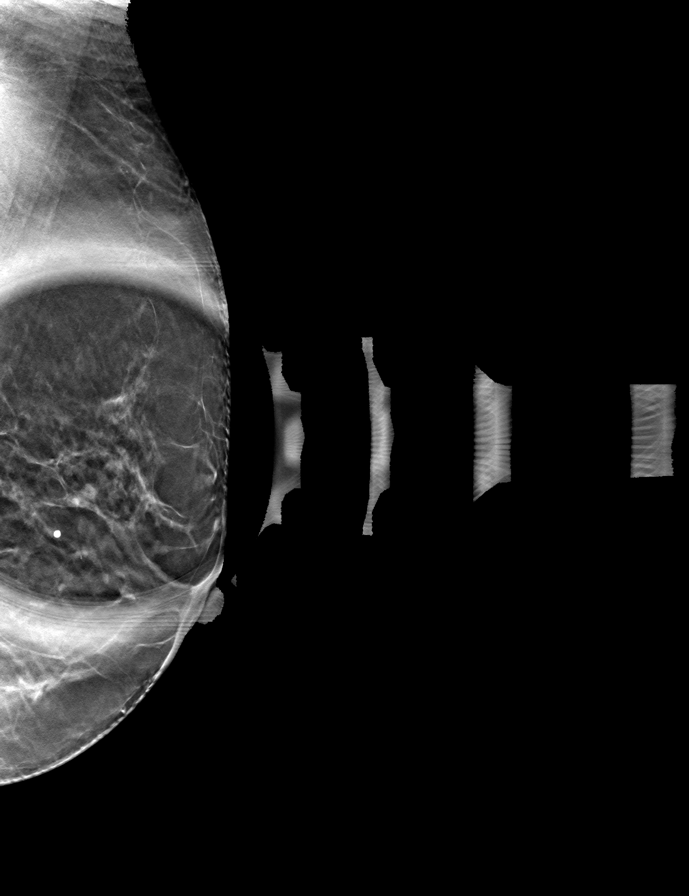

[L ML tomo · tomo slice 29/56.0]
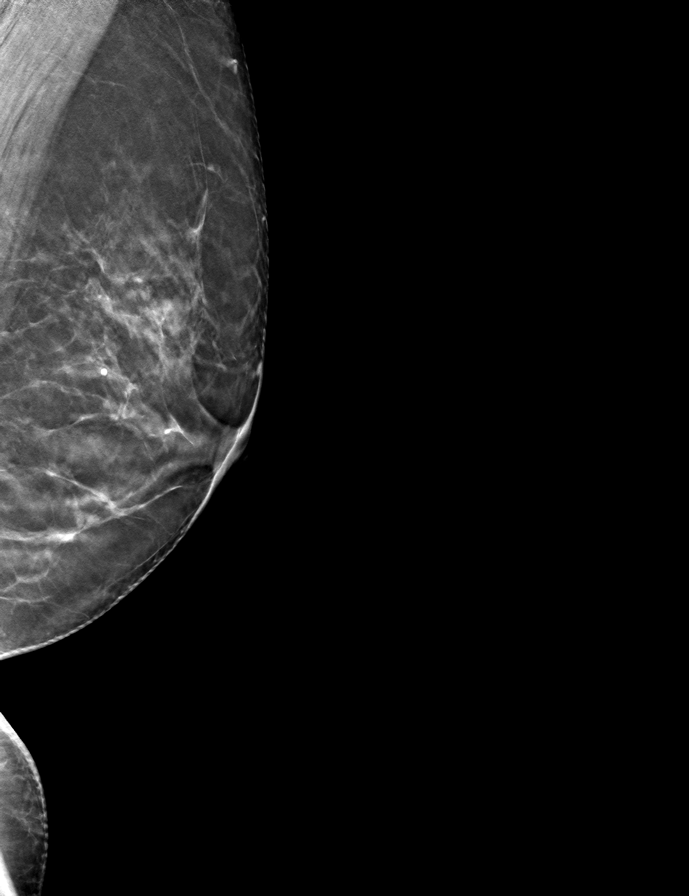

[R MLO tomo · tomo slice 25/50.0]
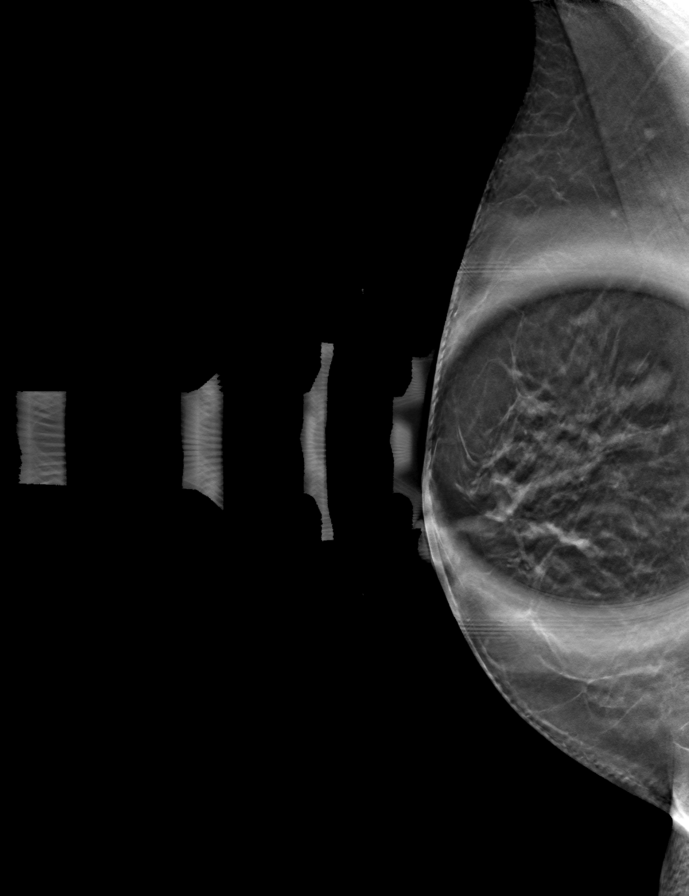

[8 of 24 positions shown; findings below may reference images not displayed]

ACR Breast Density Category c: The breast tissue is heterogeneously
dense, which may obscure small masses.
FINDINGS: The asymmetry in the lateral far posterior right breast is stable as
compared to the patient's prior mammograms. There is a possible
obscured mass in the superior aspect of the left breast.

Ultrasound targeted to the superior left breast demonstrates 2
anechoic oval circumscribed masses. The smaller is at 12 o'clock, 2
cm from the nipple measuring 4 x 2 x 4 mm, and the slightly larger
is at [DATE], 3 cm from the nipple measuring 5 x 2 x 4 mm.
IMPRESSION: 1.  Stable right breast asymmetry, which is a benign finding.

2.  Benign fibrocystic changes in the left breast.

RECOMMENDATION:
Screening mammogram in one year.(Code:KG-O-PFE)

I have discussed the findings and recommendations with the patient.
If applicable, a reminder letter will be sent to the patient
regarding the next appointment.

BI-RADS CATEGORY  2: Benign.
